# Patient Record
Sex: Male | Born: 1980 | Race: White | Hispanic: No | Marital: Married | Smoking: Never smoker
Health system: Southern US, Community
[De-identification: ages and names within clinical notes are randomized; demographics above are authoritative.]

## PROBLEM LIST (undated history)

## (undated) DIAGNOSIS — F410 Panic disorder [episodic paroxysmal anxiety] without agoraphobia: Secondary | ICD-10-CM

## (undated) DIAGNOSIS — I10 Essential (primary) hypertension: Secondary | ICD-10-CM

## (undated) DIAGNOSIS — S46219A Strain of muscle, fascia and tendon of other parts of biceps, unspecified arm, initial encounter: Secondary | ICD-10-CM

## (undated) DIAGNOSIS — R61 Generalized hyperhidrosis: Secondary | ICD-10-CM

## (undated) HISTORY — DX: Strain of muscle, fascia and tendon of other parts of biceps, unspecified arm, initial encounter: S46.219A

## (undated) HISTORY — DX: Panic disorder (episodic paroxysmal anxiety): F41.0

## (undated) HISTORY — DX: Generalized hyperhidrosis: R61

## (undated) HISTORY — DX: Essential (primary) hypertension: I10

---

## 2015-10-15 LAB — BASIC METABOLIC PANEL: GLUCOSE: 88 mg/dL

## 2015-10-15 LAB — LIPID PANEL
Cholesterol: 187 mg/dL (ref 0–200)
HDL: 45 mg/dL (ref 35–70)
LDL CALC: 125 mg/dL
LDL/HDL RATIO: 4.2
TRIGLYCERIDES: 85 mg/dL (ref 40–160)

## 2015-11-16 ENCOUNTER — Ambulatory Visit (INDEPENDENT_AMBULATORY_CARE_PROVIDER_SITE_OTHER): Payer: Commercial Managed Care - HMO | Admitting: Family Medicine

## 2015-11-16 ENCOUNTER — Encounter: Payer: Self-pay | Admitting: Family Medicine

## 2015-11-16 VITALS — BP 154/88 | HR 87 | Ht 67.0 in | Wt 192.0 lb

## 2015-11-16 DIAGNOSIS — I10 Essential (primary) hypertension: Secondary | ICD-10-CM

## 2015-11-16 DIAGNOSIS — F410 Panic disorder [episodic paroxysmal anxiety] without agoraphobia: Secondary | ICD-10-CM | POA: Diagnosis not present

## 2015-11-16 DIAGNOSIS — Z23 Encounter for immunization: Secondary | ICD-10-CM | POA: Diagnosis not present

## 2015-11-16 HISTORY — DX: Panic disorder (episodic paroxysmal anxiety): F41.0

## 2015-11-16 HISTORY — DX: Essential (primary) hypertension: I10

## 2015-11-16 MED ORDER — SERTRALINE HCL 25 MG PO TABS: 25.0000 mg | ORAL_TABLET | Freq: Every day | ORAL | Status: DC

## 2015-11-16 MED ORDER — LISINOPRIL 10 MG PO TABS: 10.0000 mg | ORAL_TABLET | Freq: Every day | ORAL | Status: DC

## 2015-11-16 NOTE — Progress Notes (Signed)
       Anthony Conway is a 35 y.o. male who presents to [facility] today for establish care and discuss possible panic attack.  Patient was in his normal state of health last week when he experienced palpitations and chest tightness while driving. This was felt with an overwhelming sense of anxiety. He pulled over and called EMS who presented to his car. He was diagnosed with a panic attack. Since then he's had felt worsening anxiety. He is fearful that he may have a repeat panic attack at any time. He especially is concerned about driving. He denies any current or new chest pains palpitations or shortness of breath. He denies any illicit drug use. He does not take any medications regularly. He is fit and active. He does note that he recently had biometric screening and occupational health clinic and was found to have elevated blood pressure.   History reviewed. No pertinent past medical history. History reviewed. No pertinent past surgical history. Social History  Substance Use Topics  . Smoking status: Current Every Day Smoker    Types: E-cigarettes  . Smokeless tobacco: Not on file  . Alcohol Use: 0.0 oz/week    0 Standard drinks or equivalent per week   family history includes Cancer in his maternal grandmother; Hypertension in his paternal grandfather; Stroke in his paternal grandfather.  ROS as above: No headache, visual changes, nausea, vomiting, diarrhea, constipation, dizziness, abdominal pain, skin rash, fevers, chills, night sweats, weight loss, swollen lymph nodes, body aches, joint swelling, muscle aches, chest pain, shortness of breath, mood changes, visual or auditory hallucinations.   Medications: Current Outpatient Prescriptions  Medication Sig Dispense Refill  . lisinopril (PRINIVIL,ZESTRIL) 10 MG tablet Take 1 tablet (10 mg total) by mouth daily. 30 tablet 0  . sertraline  (ZOLOFT) 25 MG tablet Take 1 tablet (25 mg total) by mouth daily. 30 tablet 0   No current facility-administered medications for this visit.   No Known Allergies   Exam:  BP 154/88 mmHg  Pulse 87  Ht 5\' 7"  (1.702 m)  Wt 192 lb (87.091 kg)  BMI 30.06 kg/m2 Gen: Well NAD HEENT: EOMI,  MMM Lungs: Normal work of breathing. CTABL Heart: RRR no MRG Abd: NABS, Soft. Nondistended, Nontender Exts: Brisk capillary refill, warm and well perfused.  Psych: Alert and oriented normal speech thought process and affect.  GAD 7 is 14 PHQ9 is 5 MDQ is 1  Twelve-lead EKG shows normal sinus rhythm at 72 bpm. No ST segment elevation or depression. Normal-appearing EKG.  No results found for this or any previous visit (from the past 24 hour(s)). No results found.   Please see individual assessment and plan sections.  Tdap given prior to discharge.

## 2015-11-16 NOTE — Patient Instructions (Signed)
Thank you for coming in today. Start both pills.  Attend counseling. Return in 2-4 weeks. Bring the lab records soon.  Panic Attacks Panic attacks are sudden, short-livedsurges of severe anxiety, fear, or discomfort. They may occur for no reason when you are relaxed, when you are anxious, or when you are sleeping. Panic attacks may occur for a number of reasons:   Healthy people occasionally have panic attacks in extreme, life-threatening situations, such as war or natural disasters. Normal anxiety is a protective mechanism of the body that helps [place] react to danger (fight or flight response).  Panic attacks are often seen with anxiety disorders, such as panic disorder, social anxiety disorder, generalized anxiety disorder, and phobias. Anxiety disorders cause excessive or uncontrollable anxiety. They may interfere with your relationships or other life activities.  Panic attacks are sometimes seen with other mental illnesses, such as depression and posttraumatic stress disorder.  Certain medical conditions, prescription medicines, and drugs of abuse can cause panic attacks. SYMPTOMS  Panic attacks start suddenly, peak within 20 minutes, and are accompanied by four or more of the following symptoms:  Pounding heart or fast heart rate (palpitations).  Sweating.  Trembling or shaking.  Shortness of breath or feeling smothered.  Feeling choked.  Chest pain or discomfort.  Nausea or strange feeling in your stomach.  Dizziness, light-headedness, or feeling like you will faint.  Chills or hot flushes.  Numbness or tingling in your lips or hands and feet.  Feeling that things are not real or feeling that you are not yourself.  Fear of losing control or going crazy.  Fear of dying. Some of these symptoms can mimic serious medical conditions. For example, you may think you are having a heart attack. Although panic attacks can be very scary, they are not life threatening. DIAGNOSIS    Panic attacks are diagnosed through an assessment by your health care provider. Your health care provider will ask questions about your symptoms, such as where and when they occurred. Your health care provider will also ask about your medical history and use of alcohol and drugs, including prescription medicines. Your health care provider may order blood tests or other studies to rule out a serious medical condition. Your health care provider may refer you to a mental health professional for further evaluation. TREATMENT   Most healthy people who have one or two panic attacks in an extreme, life-threatening situation will not require treatment.  The treatment for panic attacks associated with anxiety disorders or other mental illness typically involves counseling with a mental health professional, medicine, or a combination of both. Your health care provider will help determine what treatment is best for you.  Panic attacks due to physical illness usually go away with treatment of the illness. If prescription medicine is causing panic attacks, talk with your health care provider about stopping the medicine, decreasing the dose, or substituting another medicine.  Panic attacks due to alcohol or drug abuse go away with abstinence. Some adults need professional help in order to stop drinking or using drugs. HOME CARE INSTRUCTIONS   Take all medicines as directed by your health care provider.   Schedule and attend follow-up visits as directed by your health care provider. It is important to keep all your appointments. SEEK MEDICAL CARE IF:  You are not able to take your medicines as prescribed.  Your symptoms do not improve or get worse. SEEK IMMEDIATE MEDICAL CARE IF:   You experience panic attack symptoms that are different  than your usual symptoms.  You have serious thoughts about hurting yourself or others.  You are taking medicine for panic attacks and have a serious side effect. MAKE  SURE YOU:  Understand these instructions.  Will watch your condition.  Will get help right away if you are not doing well or get worse.   This information is not intended to replace advice given to you by your health care provider. Make sure you discuss any questions you have with your health care provider.   Document Released: 07/31/2005 Document Revised: 08/05/2013 Document Reviewed: 03/14/2013 Elsevier Interactive Patient Education 2016 [employer].  Hypertension Hypertension, commonly called high blood pressure, is when the force of blood pumping through your arteries is too strong. Your arteries are the blood vessels that carry blood from your heart throughout your body. A blood pressure reading consists of a higher number over a lower number, such as 110/72. The higher number (systolic) is the pressure inside your arteries when your heart pumps. The lower number (diastolic) is the pressure inside your arteries when your heart relaxes. Ideally you want your blood pressure below 120/80. Hypertension forces your heart to work harder to pump blood. Your arteries may become narrow or stiff. Having untreated or uncontrolled hypertension can cause heart attack, stroke, kidney disease, and other problems. RISK FACTORS Some risk factors for high blood pressure are controllable. Others are not.  Risk factors you cannot control include:   Race. You may be at higher risk if you are African American.  Age. Risk increases with age.  Gender. Men are at higher risk than women before age 56 years. After age 76, women are at higher risk than men. Risk factors you can control include:  Not getting enough exercise or physical activity.  Being overweight.  Getting too much fat, sugar, calories, or salt in your diet.  Drinking too much alcohol. SIGNS AND SYMPTOMS Hypertension does not usually cause signs or symptoms. Extremely high blood pressure (hypertensive crisis) may cause headache,  anxiety, shortness of breath, and nosebleed. DIAGNOSIS To check if you have hypertension, your health care provider will measure your blood pressure while you are seated, with your arm held at the level of your heart. It should be measured at least twice using the same arm. Certain conditions can cause a difference in blood pressure between your right and left arms. A blood pressure reading that is higher than normal on one occasion does not mean that you need treatment. If it is not clear whether you have high blood pressure, you may be asked to return on a different day to have your blood pressure checked again. Or, you may be asked to monitor your blood pressure at home for 1 or more weeks. TREATMENT Treating high blood pressure includes making lifestyle changes and possibly taking medicine. Living a healthy lifestyle can help lower high blood pressure. You may need to change some of your habits. Lifestyle changes may include:  Following the DASH diet. This diet is high in fruits, vegetables, and whole grains. It is low in salt, red meat, and added sugars.  Keep your sodium intake below 2,300 mg per day.  Getting at least 30-45 minutes of aerobic exercise at least 4 times per week.  Losing weight if necessary.  Not smoking.  Limiting alcoholic beverages.  Learning ways to reduce stress. Your health care provider may prescribe medicine if lifestyle changes are not enough to get your blood pressure under control, and if one of the following  is true:  You are 2818-37 years of age and your systolic blood pressure is above 140.  You are 37 years of age or older, and your systolic blood pressure is above 150.  Your diastolic blood pressure is above 90.  You have diabetes, and your systolic blood pressure is over 140 or your diastolic blood pressure is over 90.  You have kidney disease and your blood pressure is above 140/90.  You have heart disease and your blood pressure is above  140/90. Your personal target blood pressure may vary depending on your medical conditions, your age, and other factors. HOME CARE INSTRUCTIONS  Have your blood pressure rechecked as directed by your health care provider.   Take medicines only as directed by your health care provider. Follow the directions carefully. Blood pressure medicines must be taken as prescribed. The medicine does not work as well when you skip doses. Skipping doses also puts you at risk for problems.  Do not smoke.   Monitor your blood pressure at home as directed by your health care provider. SEEK MEDICAL CARE IF:   You think you are having a reaction to medicines taken.  You have recurrent headaches or feel dizzy.  You have swelling in your ankles.  You have trouble with your vision. SEEK IMMEDIATE MEDICAL CARE IF:  You develop a severe headache or confusion.  You have unusual weakness, numbness, or feel faint.  You have severe chest or abdominal pain.  You vomit repeatedly.  You have trouble breathing. MAKE SURE YOU:   Understand these instructions.  Will watch your condition.  Will get help right away if you are not doing well or get worse.   This information is not intended to replace advice given to you by your health care provider. Make sure you discuss any questions you have with your health care provider.   Document Released: 07/31/2005 Document Revised: 12/15/2014 Document Reviewed: 05/23/2013 Elsevier Interactive Patient Education [employer]2016 Elsevier Inc.

## 2015-11-16 NOTE — Assessment & Plan Note (Signed)
Symptoms are consistent with panic disorder with some component of generalized anxiety disorder. Plan to start low-dose sertraline and refer to counseling. Recheck in 2-4 weeks.

## 2015-11-16 NOTE — Assessment & Plan Note (Signed)
Truly elevated multiple visits. Patient has had recent labs. He'll bring labs records. Start low-dose lisinopril. Recheck in one month.

## 2015-12-03 ENCOUNTER — Encounter: Payer: Self-pay | Admitting: Family Medicine

## 2015-12-03 ENCOUNTER — Ambulatory Visit (INDEPENDENT_AMBULATORY_CARE_PROVIDER_SITE_OTHER): Payer: Commercial Managed Care - HMO | Admitting: Family Medicine

## 2015-12-03 VITALS — BP 139/83 | HR 74 | Wt 189.0 lb

## 2015-12-03 DIAGNOSIS — I10 Essential (primary) hypertension: Secondary | ICD-10-CM

## 2015-12-03 DIAGNOSIS — R61 Generalized hyperhidrosis: Secondary | ICD-10-CM | POA: Diagnosis not present

## 2015-12-03 DIAGNOSIS — F410 Panic disorder [episodic paroxysmal anxiety] without agoraphobia: Secondary | ICD-10-CM | POA: Diagnosis not present

## 2015-12-03 HISTORY — DX: Generalized hyperhidrosis: R61

## 2015-12-03 LAB — BASIC METABOLIC PANEL
BUN: 22 mg/dL (ref 7–25)
CHLORIDE: 103 mmol/L (ref 98–110)
CO2: 24 mmol/L (ref 20–31)
CREATININE: 1.38 mg/dL — AB (ref 0.60–1.35)
Calcium: 9.3 mg/dL (ref 8.6–10.3)
Glucose, Bld: 88 mg/dL (ref 65–99)
Potassium: 4.4 mmol/L (ref 3.5–5.3)
Sodium: 137 mmol/L (ref 135–146)

## 2015-12-03 MED ORDER — LISINOPRIL 10 MG PO TABS: 10.0000 mg | ORAL_TABLET | Freq: Every day | ORAL | Status: DC

## 2015-12-03 MED ORDER — ALUMINUM CHLORIDE 20 % EX SOLN: CUTANEOUS | Status: DC

## 2015-12-03 MED ORDER — SERTRALINE HCL 25 MG PO TABS: 25.0000 mg | ORAL_TABLET | Freq: Every day | ORAL | Status: DC

## 2015-12-03 NOTE — Assessment & Plan Note (Signed)
Doing well on lisinopril. Check basic metabolic panel. Return in one year or sooner if needed.

## 2015-12-03 NOTE — Assessment & Plan Note (Signed)
Original goal was to increase to 100 mg daily. However the patient has complete resolution of symptoms. Continue 25 mg and recheck in 1 year or sooner if needed

## 2015-12-03 NOTE — Progress Notes (Signed)
       Anthony Conway is a 35 y.o. male who [facility] today for follow-up hypertension and anxiety. Patient is doing very well. His anxiety has essentially completely resolved. He denies any chest pains palpitations or shortness of breath. He notes a history of excessive sweating and was previously prescribed aluminum chloride antiperspirant. He would like a refill if possible.   No past medical history on file. No past surgical history on file. Social History  Substance Use Topics  . Smoking status: Current Every Day Smoker    Types: E-cigarettes  . Smokeless tobacco: Not on file  . Alcohol Use: 0.0 oz/week    0 Standard drinks or equivalent per week   family history includes Cancer in his maternal grandmother; Hypertension in his paternal grandfather; Stroke in his paternal grandfather.  ROS as above Medications: Current Outpatient Prescriptions  Medication Sig Dispense Refill  . lisinopril (PRINIVIL,ZESTRIL) 10 MG tablet Take 1 tablet (10 mg total) by mouth daily. 90 tablet 3  . sertraline (ZOLOFT) 25 MG tablet Take 1 tablet (25 mg total) by mouth daily. 90 tablet 3  . aluminum chloride (DRYSOL) 20 % external solution Apply daily at bedtime for 3 days then weekly to areas of excessive sweat. 225 mL 12   No current facility-administered medications for this visit.   No Known Allergies   Exam:  BP 139/83 mmHg  Pulse 74  Wt 189 lb (85.73 kg) Gen: Well NAD HEENT: EOMI,  MMM Lungs: Normal work of breathing. CTABL Heart: RRR no MRG Abd: NABS, Soft. Nondistended, Nontender Exts: Brisk capillary refill, warm and well perfused.  Psych: Alert and oriented normal speech thought process and affect  GAD 7 is 0  No results found for this or any previous visit (from the past 24 hour(s)). No results found.   Please see individual assessment and plan sections.

## 2015-12-03 NOTE — Patient Instructions (Signed)
Thank you for coming in today. Continue the medicines.  Start Drysol.  Get labs today.  Return in 1 year or sooner if needed.

## 2015-12-03 NOTE — Assessment & Plan Note (Signed)
Prescribe Drysol

## 2015-12-05 NOTE — Progress Notes (Signed)
Quick Note:  The kidney function is a bit abnormal. This is probably a false result due to the labs. We will repeat the labs in 1 month. Return for a lab visit (no need to see me) in 1 month. ______

## 2016-01-20 ENCOUNTER — Other Ambulatory Visit: Payer: Self-pay | Admitting: Family Medicine

## 2016-01-20 DIAGNOSIS — R7989 Other specified abnormal findings of blood chemistry: Secondary | ICD-10-CM

## 2016-01-21 LAB — BASIC METABOLIC PANEL WITH GFR
BUN: 19 mg/dL (ref 7–25)
CHLORIDE: 99 mmol/L (ref 98–110)
CO2: 26 mmol/L (ref 20–31)
Calcium: 9.6 mg/dL (ref 8.6–10.3)
Creat: 1.4 mg/dL — ABNORMAL HIGH (ref 0.60–1.35)
GFR, EST AFRICAN AMERICAN: 74 mL/min (ref >=60)
GFR, Est Non African American: 64 mL/min (ref >=60)
GLUCOSE: 81 mg/dL (ref 65–99)
POTASSIUM: 4.3 mmol/L (ref 3.5–5.3)
Sodium: 137 mmol/L (ref 135–146)

## 2016-01-21 NOTE — Progress Notes (Signed)
Quick Note:  Labs are stable. Creatine is still elevated. We will watch this every year. ______

## 2016-08-14 HISTORY — PX: DISTAL BICEPS TENDON REPAIR: SHX1461

## 2016-12-12 ENCOUNTER — Other Ambulatory Visit: Payer: Self-pay | Admitting: Family Medicine

## 2016-12-12 DIAGNOSIS — F410 Panic disorder [episodic paroxysmal anxiety] without agoraphobia: Secondary | ICD-10-CM

## 2016-12-12 DIAGNOSIS — I10 Essential (primary) hypertension: Secondary | ICD-10-CM

## 2017-01-14 ENCOUNTER — Other Ambulatory Visit: Payer: Self-pay | Admitting: Family Medicine

## 2017-01-14 DIAGNOSIS — I10 Essential (primary) hypertension: Secondary | ICD-10-CM

## 2017-01-14 DIAGNOSIS — F410 Panic disorder [episodic paroxysmal anxiety] without agoraphobia: Secondary | ICD-10-CM

## 2017-01-22 ENCOUNTER — Other Ambulatory Visit: Payer: Self-pay | Admitting: Family Medicine

## 2017-01-22 DIAGNOSIS — I10 Essential (primary) hypertension: Secondary | ICD-10-CM

## 2017-03-22 ENCOUNTER — Ambulatory Visit (INDEPENDENT_AMBULATORY_CARE_PROVIDER_SITE_OTHER): Payer: 59 | Admitting: Family Medicine

## 2017-03-22 ENCOUNTER — Ambulatory Visit (INDEPENDENT_AMBULATORY_CARE_PROVIDER_SITE_OTHER): Payer: 59

## 2017-03-22 DIAGNOSIS — M25522 Pain in left elbow: Secondary | ICD-10-CM

## 2017-03-22 DIAGNOSIS — X58XXXA Exposure to other specified factors, initial encounter: Secondary | ICD-10-CM

## 2017-03-22 DIAGNOSIS — S59802A Other specified injuries of left elbow, initial encounter: Secondary | ICD-10-CM

## 2017-03-22 NOTE — Progress Notes (Signed)
Anthony Conway is a 36 y.o. male who presents to [facility] today for left arm pain.  Patient reports left arm pain for 3 days. Patient reached out to catch himself with his left arm and hyperextended it. When the injury occurred he noticed a crunch and a pop. The arm was immediately painful and 8/10 in severity. He noticed significant bruising and swelling over the next day in his lower arm and upper forearm. Patient reports icing these areas with some improvement in the swelling. Patient has noted that he cannot fully flex his left bicep and hyperextending his arm produces pain. The pain is now about a 1/10 in severity while at rest.    No past medical history on file. No past surgical history on file. Social History  Substance Use Topics  . Smoking status: Current Every Day Smoker    Types: E-cigarettes  . Smokeless tobacco: Not on file  . Alcohol use 0.0 oz/week     ROS:  As above   Medications: Current Outpatient Prescriptions  Medication Sig Dispense Refill  . lisinopril (PRINIVIL,ZESTRIL) 10 MG tablet Take 1 tablet (10 mg total) by mouth daily. Due for follow up visit (Patient not taking: Reported on 03/22/2017) 30 tablet 0  . sertraline (ZOLOFT) 25 MG tablet Take 1 tablet (25 mg total) by mouth daily. Due for follow up visit (Patient not taking: Reported on 03/22/2017) 30 tablet 0   No current facility-administered medications for this visit.    No Known Allergies   Exam:  BP 134/88   Pulse 80   Wt 203 lb (92.1 kg)   BMI 31.79 kg/m  General: Well Developed, well nourished, and in no acute distress.  Neuro/Psych: Alert and oriented x3, extra-ocular muscles intact, able to move all 4 extremities, sensation grossly intact. Skin: Warm and dry, no rashes noted.  Respiratory: Not using accessory muscles, speaking in full sentences, trachea midline.  Cardiovascular: Pulses palpable, no extremity edema. Abdomen: Does not appear  distended. MSK: Left arm: On inspection biceps is retracted superiorly when compared to right, significant ecchymosis present in lower 1/3 of arm and upper 2/3 of forearm, swelling present Tender to palpation over area of lower biceps tendon, unable to isolate biceps tendon with Hook Test Range of motion is full Decreased strength with supination of forearm, decreased strength with flexion of forearm     No results found for this or any previous visit (from the past 48 hour(s)). Dg Elbow Complete Left  Result Date: 03/22/2017 CLINICAL DATA:  Hyperextension injury to the left elbow 4 days ago. EXAM: LEFT ELBOW - COMPLETE 3+ VIEW COMPARISON:  None. FINDINGS: The joint spaces are maintained. No acute fractures identified. No joint effusion. IMPRESSION: No acute bony findings. Electronically Signed   By: Rudie Meyer M.D.   On: 03/22/2017 15:08     Patient was placed into a well-formed posterior arm splint with a 90 elbow flexion  Assessment and Plan: 36 y.o. male with left arm pain. Given patient's characterization of injury and physical exam findings, I am concerned that he has a distal biceps tendon tear. Patient was placed in a posterior arm splint to keep his elbow at 90 degrees of flexion. Plan for MRI of the elbow to evaluate for tendon rupture. Patient will most likely require a referral to orthopedic surgery for surgical repair of the tendon. Patient lives and works in [place] and would like to go to see an Investment banker, operational in [place].  Orders Placed This Encounter  Procedures  . MR ELBOW LEFT WO CONTRAST    Standing Status:   Future    Standing Expiration Date:   05/22/2018    Order Specific Question:   What is the patient's sedation requirement?    Answer:   No Sedation    Order Specific Question:   Does the patient have a pacemaker or implanted devices?    Answer:   No    Order Specific Question:   Preferred imaging location?    Answer[facility]   [facility] (table  limit-350lbs)    Order Specific Question:   Radiology Contrast Protocol - do NOT remove file path    Answer:   \\[email or web address]  . DG Elbow Complete Left    Standing Status:   Future    Number of Occurrences:   1    Standing Expiration Date:   05/22/2018    Order Specific Question:   Reason for Exam (SYMPTOM  OR DIAGNOSIS REQUIRED)    Answer:   eval poss biceps tendon tear. OK to remove splint    Order Specific Question:   Preferred imaging location?    [facility]    Order Specific Question:   Radiology Contrast Protocol - do NOT remove file path    Answer:   \\[email or web address]   No orders of the defined types were placed in this encounter.   Discussed warning signs or symptoms. Please see discharge instructions. Patient expresses understanding.

## 2017-03-22 NOTE — Patient Instructions (Signed)
Thank you for coming in today.  You should hear about MRI soon.  Get xray now.   Recheck with me a few days after MRI.

## 2017-03-22 NOTE — Progress Notes (Signed)
1 

## 2017-03-23 ENCOUNTER — Other Ambulatory Visit: Payer: Self-pay | Admitting: Family Medicine

## 2017-03-23 DIAGNOSIS — Z1389 Encounter for screening for other disorder: Secondary | ICD-10-CM

## 2017-03-23 DIAGNOSIS — Z0189 Encounter for other specified special examinations: Secondary | ICD-10-CM

## 2017-03-26 ENCOUNTER — Ambulatory Visit (INDEPENDENT_AMBULATORY_CARE_PROVIDER_SITE_OTHER): Payer: 59

## 2017-03-26 DIAGNOSIS — Z575 Occupational exposure to toxic agents in other industries: Secondary | ICD-10-CM

## 2017-03-26 DIAGNOSIS — S46212D Strain of muscle, fascia and tendon of other parts of biceps, left arm, subsequent encounter: Secondary | ICD-10-CM

## 2017-03-26 DIAGNOSIS — X58XXXD Exposure to other specified factors, subsequent encounter: Secondary | ICD-10-CM

## 2017-03-26 DIAGNOSIS — Z1389 Encounter for screening for other disorder: Secondary | ICD-10-CM | POA: Diagnosis not present

## 2017-03-26 DIAGNOSIS — M25522 Pain in left elbow: Secondary | ICD-10-CM

## 2017-03-27 ENCOUNTER — Telehealth: Payer: Self-pay | Admitting: Family Medicine

## 2017-03-27 DIAGNOSIS — S46212A Strain of muscle, fascia and tendon of other parts of biceps, left arm, initial encounter: Secondary | ICD-10-CM

## 2017-03-27 DIAGNOSIS — S46219A Strain of muscle, fascia and tendon of other parts of biceps, unspecified arm, initial encounter: Secondary | ICD-10-CM | POA: Insufficient documentation

## 2017-03-27 HISTORY — DX: Strain of muscle, fascia and tendon of other parts of biceps, unspecified arm, initial encounter: S46.219A

## 2017-03-27 NOTE — Telephone Encounter (Signed)
Pt advised of results and has been scheduled for an appointment. Referral coordinator went over appointment detail. No further questions.

## 2017-03-27 NOTE — Telephone Encounter (Signed)
MRI confirms complete tear of the biceps tendon.  This will require surgery.  I have ordered a referral today.  You should hear soon from an orthopedic surgery office soon about the details.  Please swing by the radiology department and get a CD of the MRI made to bring with you to the orthopedic surgery appointment.  Return to clinic if needed for further evaluation or help.

## 2017-10-18 LAB — LIPID PANEL
Cholesterol: 184 (ref 0–200)
HDL: 45 (ref 35–70)
LDL CALC: 122
LDl/HDL Ratio: 4.1
Triglycerides: 78 (ref 40–160)

## 2017-10-18 LAB — BASIC METABOLIC PANEL: Glucose: 96

## 2017-12-12 ENCOUNTER — Ambulatory Visit (INDEPENDENT_AMBULATORY_CARE_PROVIDER_SITE_OTHER): Payer: 59 | Admitting: Family Medicine

## 2017-12-12 ENCOUNTER — Encounter: Payer: Self-pay | Admitting: Family Medicine

## 2017-12-12 VITALS — BP 143/87 | HR 80 | Ht 67.0 in | Wt 197.0 lb

## 2017-12-12 DIAGNOSIS — I10 Essential (primary) hypertension: Secondary | ICD-10-CM | POA: Diagnosis not present

## 2017-12-12 DIAGNOSIS — Z114 Encounter for screening for human immunodeficiency virus [HIV]: Secondary | ICD-10-CM | POA: Diagnosis not present

## 2017-12-12 DIAGNOSIS — F410 Panic disorder [episodic paroxysmal anxiety] without agoraphobia: Secondary | ICD-10-CM | POA: Diagnosis not present

## 2017-12-12 DIAGNOSIS — Z0000 Encounter for general adult medical examination without abnormal findings: Secondary | ICD-10-CM

## 2017-12-12 MED ORDER — LISINOPRIL 10 MG PO TABS: 10.0000 mg | ORAL_TABLET | Freq: Every day | ORAL | 1 refills | Status: DC

## 2017-12-12 MED ORDER — SERTRALINE HCL 50 MG PO TABS: 50.0000 mg | ORAL_TABLET | Freq: Every day | ORAL | 1 refills | Status: DC

## 2017-12-12 NOTE — Progress Notes (Signed)
Anthony Conway is a 37 y.o. male who presents to [facility]: Primary Care Sports Medicine today for well adult visit.  Anthony Conway presents to clinic today feeling pretty well.  He has a history of hypertension and anxiety.  He has discontinued both the lisinopril and the Zoloft.  He was previously taking 10 mg of lisinopril and 25 mg of sertraline.  He ran out of the medications.  He notes that he really could not tell that he was taking the lisinopril at all and was not measuring blood pressure at home.  As for the sertraline he notes that his anxiety was he thinks better controlled on it.  He notes his anxiety is worsening off of the medication now for some time.  He no longer is having current panic attacks.  Patient notes that he had labs performed at work in March.  He had a fasting cholesterol and glucose that were in the normal range and abstracted below.   Past Medical History:  Diagnosis Date  . Excessive sweating 12/03/2015  . HTN (hypertension) 11/16/2015  . Panic attack 11/16/2015  . Tear of distal tendon of biceps 03/27/2017   Past Surgical History:  Procedure Laterality Date  . DISTAL BICEPS TENDON REPAIR Left 2018   Social History   Tobacco Use  . Smoking status: Never Smoker  . Smokeless tobacco: Never Used  Substance Use Topics  . Alcohol use: Yes    Alcohol/week: 0.0 oz   family history includes Cancer in his maternal grandmother; Hypertension in his paternal grandfather; Stroke in his paternal grandfather.  ROS as above:  Medications: Current Outpatient Medications  Medication Sig Dispense Refill  . lisinopril (PRINIVIL,ZESTRIL) 10 MG tablet Take 1 tablet (10 mg total) by mouth daily. 90 tablet 1  . sertraline (ZOLOFT) 50 MG tablet Take 1 tablet (50 mg total) by mouth daily. 90 tablet 1   No current facility-administered medications for this visit.    No Known Allergies  Health  Maintenance Health Maintenance  Topic Date Due  . HIV Screening  03/18/1994  . INFLUENZA VACCINE  03/14/2018  . TETANUS/TDAP  11/15/2025     Exam:  BP (!) 143/87   Pulse 80   Ht  (1.702 m)   Wt 197 lb (89.4 kg)   BMI 30.85 kg/m  Gen: Well NAD HEENT: EOMI,  MMM Lungs: Normal work of breathing. CTABL Heart: RRR no MRG Abd: NABS, Soft. Nondistended, Nontender Exts: Brisk capillary refill, warm and well perfused.  Skin: No concerning lesions. Psych alert and oriented normal speech thought process and affect.  Depression screen [facility] 2/9 12/12/2017 03/22/2017  Decreased Interest 1 0  Down, Depressed, Hopeless 0 0  PHQ - 2 Score 1 0  Altered sleeping 0 -  Tired, decreased energy 1 -  Change in appetite 0 -  Feeling bad or failure about yourself  0 -  Trouble concentrating 0 -  Moving slowly or fidgety/restless 0 -  Suicidal thoughts 0 -  PHQ-9 Score 2 -  Difficult doing work/chores Not difficult at all -   GAD 7 : Generalized Anxiety Score 12/12/2017  Nervous, Anxious, on Edge 3  Control/stop worrying 0  Worry too much - different things 1  Trouble relaxing 1  Restless 0  Easily annoyed or irritable 1  Afraid - awful might happen 1  Total GAD 7 Score 7      Lab Results  Component Value Date   CHOL 184 10/18/2017  HDL 45 10/18/2017   LDLCALC 122 10/18/2017   TRIG 78 10/18/2017     Assessment and Plan: 37 y.o. male with  Well adult.  Doing reasonably well.  Anxiety is worsening.  Individual medical problems discussed below.  Hypertension: Not at goal.  Restart lisinopril.  Send blood pressure log in 1 month.  Recheck metabolic panel in 1 month.  If all is well recheck yearly.  Anxiety: Worsening slightly.  Restart Zoloft.  Titrate from 25 to 50 mg and send report in 1 month.  If all is well recheck yearly  Additional check HIV for screening.  Orders Placed This Encounter  Procedures  . COMPLETE METABOLIC PANEL WITH GFR  . HIV antibody  . Basic  metabolic panel    This external order was created through the Results Console.  . Lipid panel    This external order was created through the Results Console.   Meds ordered this encounter  Medications  . lisinopril (PRINIVIL,ZESTRIL) 10 MG tablet    Sig: Take 1 tablet (10 mg total) by mouth daily.    Dispense:  90 tablet    Refill:  1  . sertraline (ZOLOFT) 50 MG tablet    Sig: Take 1 tablet (50 mg total) by mouth daily.    Dispense:  90 tablet    Refill:  1     Discussed warning signs or symptoms. Please see discharge instructions. Patient expresses understanding.

## 2017-12-12 NOTE — Patient Instructions (Signed)
Thank you for coming in today. Get labs in about 1 month.  Restart lisinopril at  daily.  Restart zoloft (sertaline) for anxiety.  Take 1/2 pill daily for 1 week then increase to 1 pill.  Send me an update in 1 month.  If all is well recheck yearly for physical.  I am here for issues if you have any.

## 2018-06-17 ENCOUNTER — Other Ambulatory Visit: Payer: Self-pay | Admitting: Family Medicine

## 2018-06-17 DIAGNOSIS — F410 Panic disorder [episodic paroxysmal anxiety] without agoraphobia: Secondary | ICD-10-CM

## 2018-06-17 DIAGNOSIS — I10 Essential (primary) hypertension: Secondary | ICD-10-CM

## 2018-07-19 ENCOUNTER — Other Ambulatory Visit: Payer: Self-pay | Admitting: Family Medicine

## 2018-07-19 DIAGNOSIS — F410 Panic disorder [episodic paroxysmal anxiety] without agoraphobia: Secondary | ICD-10-CM

## 2018-07-20 ENCOUNTER — Other Ambulatory Visit: Payer: Self-pay | Admitting: Family Medicine

## 2018-07-20 DIAGNOSIS — I10 Essential (primary) hypertension: Secondary | ICD-10-CM

## 2018-08-23 LAB — COMPLETE METABOLIC PANEL WITH GFR
AG Ratio: 1.8 (calc) (ref 1.0–2.5)
ALT: 16 U/L (ref 9–46)
AST: 14 U/L (ref 10–40)
Albumin: 4.7 g/dL (ref 3.6–5.1)
Alkaline phosphatase (APISO): 47 U/L (ref 40–115)
BUN: 15 mg/dL (ref 7–25)
CALCIUM: 9.9 mg/dL (ref 8.6–10.3)
CO2: 27 mmol/L (ref 20–32)
Chloride: 103 mmol/L (ref 98–110)
Creat: 1.23 mg/dL (ref 0.60–1.35)
GFR, EST AFRICAN AMERICAN: 85 mL/min/{1.73_m2} (ref >=60)
GFR, EST NON AFRICAN AMERICAN: 73 mL/min/{1.73_m2} (ref >=60)
GLUCOSE: 99 mg/dL (ref 65–99)
Globulin: 2.6 g/dL (calc) (ref 1.9–3.7)
Potassium: 4.4 mmol/L (ref 3.5–5.3)
Sodium: 139 mmol/L (ref 135–146)
TOTAL PROTEIN: 7.3 g/dL (ref 6.1–8.1)
Total Bilirubin: 0.7 mg/dL (ref 0.2–1.2)

## 2018-08-23 LAB — HIV ANTIBODY (ROUTINE TESTING W REFLEX): HIV 1&2 Ab, 4th Generation: NONREACTIVE

## 2018-08-28 ENCOUNTER — Other Ambulatory Visit: Payer: Self-pay | Admitting: Family Medicine

## 2018-08-28 DIAGNOSIS — I10 Essential (primary) hypertension: Secondary | ICD-10-CM

## 2018-08-29 MED ORDER — LISINOPRIL 10 MG PO TABS: 10.0000 mg | ORAL_TABLET | Freq: Every day | ORAL | 2 refills | Status: DC

## 2018-10-13 LAB — LIPID PANEL
Cholesterol: 189 (ref 0–200)
HDL: 47 (ref 35–70)
LDL Cholesterol: 124
Triglycerides: 85 (ref 40–160)

## 2018-10-13 LAB — BASIC METABOLIC PANEL: Glucose: 97

## 2018-10-23 ENCOUNTER — Other Ambulatory Visit: Payer: Self-pay | Admitting: Family Medicine

## 2018-10-23 DIAGNOSIS — I10 Essential (primary) hypertension: Secondary | ICD-10-CM

## 2018-12-19 ENCOUNTER — Other Ambulatory Visit: Payer: Self-pay | Admitting: Family Medicine

## 2018-12-19 DIAGNOSIS — I10 Essential (primary) hypertension: Secondary | ICD-10-CM

## 2019-01-08 ENCOUNTER — Encounter: Payer: Self-pay | Admitting: Family Medicine

## 2019-01-08 ENCOUNTER — Ambulatory Visit (INDEPENDENT_AMBULATORY_CARE_PROVIDER_SITE_OTHER): Payer: 59 | Admitting: Family Medicine

## 2019-01-08 VITALS — BP 146/84 | HR 76 | Temp 98.1°F | Wt 191.0 lb

## 2019-01-08 DIAGNOSIS — N489 Disorder of penis, unspecified: Secondary | ICD-10-CM | POA: Diagnosis not present

## 2019-01-08 DIAGNOSIS — I10 Essential (primary) hypertension: Secondary | ICD-10-CM

## 2019-01-08 DIAGNOSIS — Z0000 Encounter for general adult medical examination without abnormal findings: Secondary | ICD-10-CM

## 2019-01-08 DIAGNOSIS — F410 Panic disorder [episodic paroxysmal anxiety] without agoraphobia: Secondary | ICD-10-CM | POA: Diagnosis not present

## 2019-01-08 MED ORDER — LISINOPRIL 10 MG PO TABS: 10.0000 mg | ORAL_TABLET | Freq: Every day | ORAL | 3 refills | Status: AC

## 2019-01-08 NOTE — Progress Notes (Signed)
Anthony Conway is a 38 y.o. male who presents to [facility]: Primary Care Sports Medicine today for well adult visit.   Anthony Conway is doing well.  He notes that he has a history of anxiety and panic attacks that previously was controlled with Zoloft.  He has been out of this medicine for quite a while but feels quite well.  He is happy that things are going and would like to remain off of medication if possible.  He also has a history of hypertension.  This typically has been well controlled with lisinopril but he ran out of his medicine 3 days ago.  He does not check his blood pressure at home.  No chest pain palpitation lightheadedness shortness of breath fevers or chills.  He exercises regularly and tries eat a healthy careful diet.  He does note however he has a small rash on his penis.  This is been present for a few weeks and occurs off and on.  His girlfriend does not have any other symptoms.  He denies any pain or discharge.  He feels well otherwise.  He notes that he had labs done for work for cholesterol a few months ago.   ROS as above:  Past Medical History:  Diagnosis Date  . Excessive sweating 12/03/2015  . HTN (hypertension) 11/16/2015  . Panic attack 11/16/2015  . Tear of distal tendon of biceps 03/27/2017   Past Surgical History:  Procedure Laterality Date  . DISTAL BICEPS TENDON REPAIR Left 2018   Social History   Tobacco Use  . Smoking status: Never Smoker  . Smokeless tobacco: Never Used  Substance Use Topics  . Alcohol use: Yes    Alcohol/week: 0.0 standard drinks   family history includes Cancer in his maternal grandmother; Hypertension in his paternal grandfather; Stroke in his paternal grandfather.  Medications: Current Outpatient Medications  Medication Sig Dispense Refill  . lisinopril (ZESTRIL) 10 MG tablet Take 1 tablet (10 mg total) by mouth daily. 90 tablet 3    No current facility-administered medications for this visit.    No Known Allergies  Health Maintenance Health Maintenance  Topic Date Due  . INFLUENZA VACCINE  03/15/2019  . TETANUS/TDAP  11/15/2025  . HIV Screening  Completed     Exam:  BP (!) 146/84   Pulse 76   Temp 98.1 F (36.7 C) (Oral)   Wt 191 lb (86.6 kg)   BMI 29.91 kg/m  Wt Readings from Last 5 Encounters:  01/08/19 191 lb (86.6 kg)  12/12/17 197 lb (89.4 kg)  03/22/17 203 lb (92.1 kg)  12/03/15 189 lb (85.7 kg)  11/16/15 192 lb (87.1 kg)      Gen: Well NAD HEENT: EOMI,  MMM Lungs: Normal work of breathing. CTABL Heart: RRR no MRG Abd: NABS, Soft. Nondistended, Nontender Exts: Brisk capillary refill, warm and well perfused.  Psych: Alert and oriented normal speech thought process and affect.  No SI HI expressed. Genitals: Normal circumcised penis with small scaly patch on the undersurface of the glans.  No penile discharge nontender.  No other lesions present.  Depression screen [facility] 2/9 01/08/2019 12/12/2017 03/22/2017  Decreased Interest 0 1 0  Down, Depressed, Hopeless 0 0 0  PHQ - 2 Score 0 1 0  Altered sleeping 1 0 -  Tired, decreased energy 0 1 -  Change in appetite 0 0 -  Feeling bad or failure about yourself  0 0 -  Trouble concentrating 0  0 -  Moving slowly or fidgety/restless 0 0 -  Suicidal thoughts 0 0 -  PHQ-9 Score 1 2 -  Difficult doing work/chores Not difficult at all Not difficult at all -       Assessment and Plan: 38 y.o. male with  Well adult.  Doing well.  Blood pressure bit elevated today patient out of medication.  Plan to restart lisinopril and keep home blood pressure log and report back.  Anxiety well controlled.  Hold on Zoloft for now recheck as needed.  Penile lesion looks like dry skin to me.  Plan get further work-up including gonorrhea chlamydia HIV and RPR.   PDMP not reviewed this encounter. Orders Placed This Encounter  Procedures  . C. trachomatis/N.  gonorrhoeae RNA  . HIV Antibody (routine testing w rflx)  . RPR  . Basic metabolic panel    This external order was created through the Results Console.  . Lipid panel    This external order was created through the Results Console.   Meds ordered this encounter  Medications  . lisinopril (ZESTRIL) 10 MG tablet    Sig: Take 1 tablet (10 mg total) by mouth daily.    Dispense:  90 tablet    Refill:  3     Discussed warning signs or symptoms. Please see discharge instructions. Patient expresses understanding.

## 2019-01-08 NOTE — Patient Instructions (Addendum)
Thank you for coming in today. Restart lisinopril.  Let me know what your blood pressures at home are.  Let me know if your anxiety worsens.  We can restart zoloft.    Plan on recheck in 1 year.  Return sooner if needed.  Will get labs next year.   I do recommend that you take your vit D    Send me your labs from [facility] recently via mychart.    Return sooner if needed.

## 2019-01-09 LAB — C. TRACHOMATIS/N. GONORRHOEAE RNA
C. trachomatis RNA, TMA: NOT DETECTED
N. gonorrhoeae RNA, TMA: NOT DETECTED

## 2019-01-09 LAB — HIV ANTIBODY (ROUTINE TESTING W REFLEX): HIV 1&2 Ab, 4th Generation: NONREACTIVE

## 2019-01-09 LAB — RPR: RPR Ser Ql: NONREACTIVE

## 2019-02-15 IMAGING — DX DG ORBITS FOR FOREIGN BODY
2 series · 2 of 2 positions shown · non-contrast
Comparison: None.

CLINICAL DATA: Metal working/exposure; clearance prior to MRI

EXAM:
ORBITS FOR FOREIGN BODY - 2 VIEW

[orbits waters (1 of 2)]
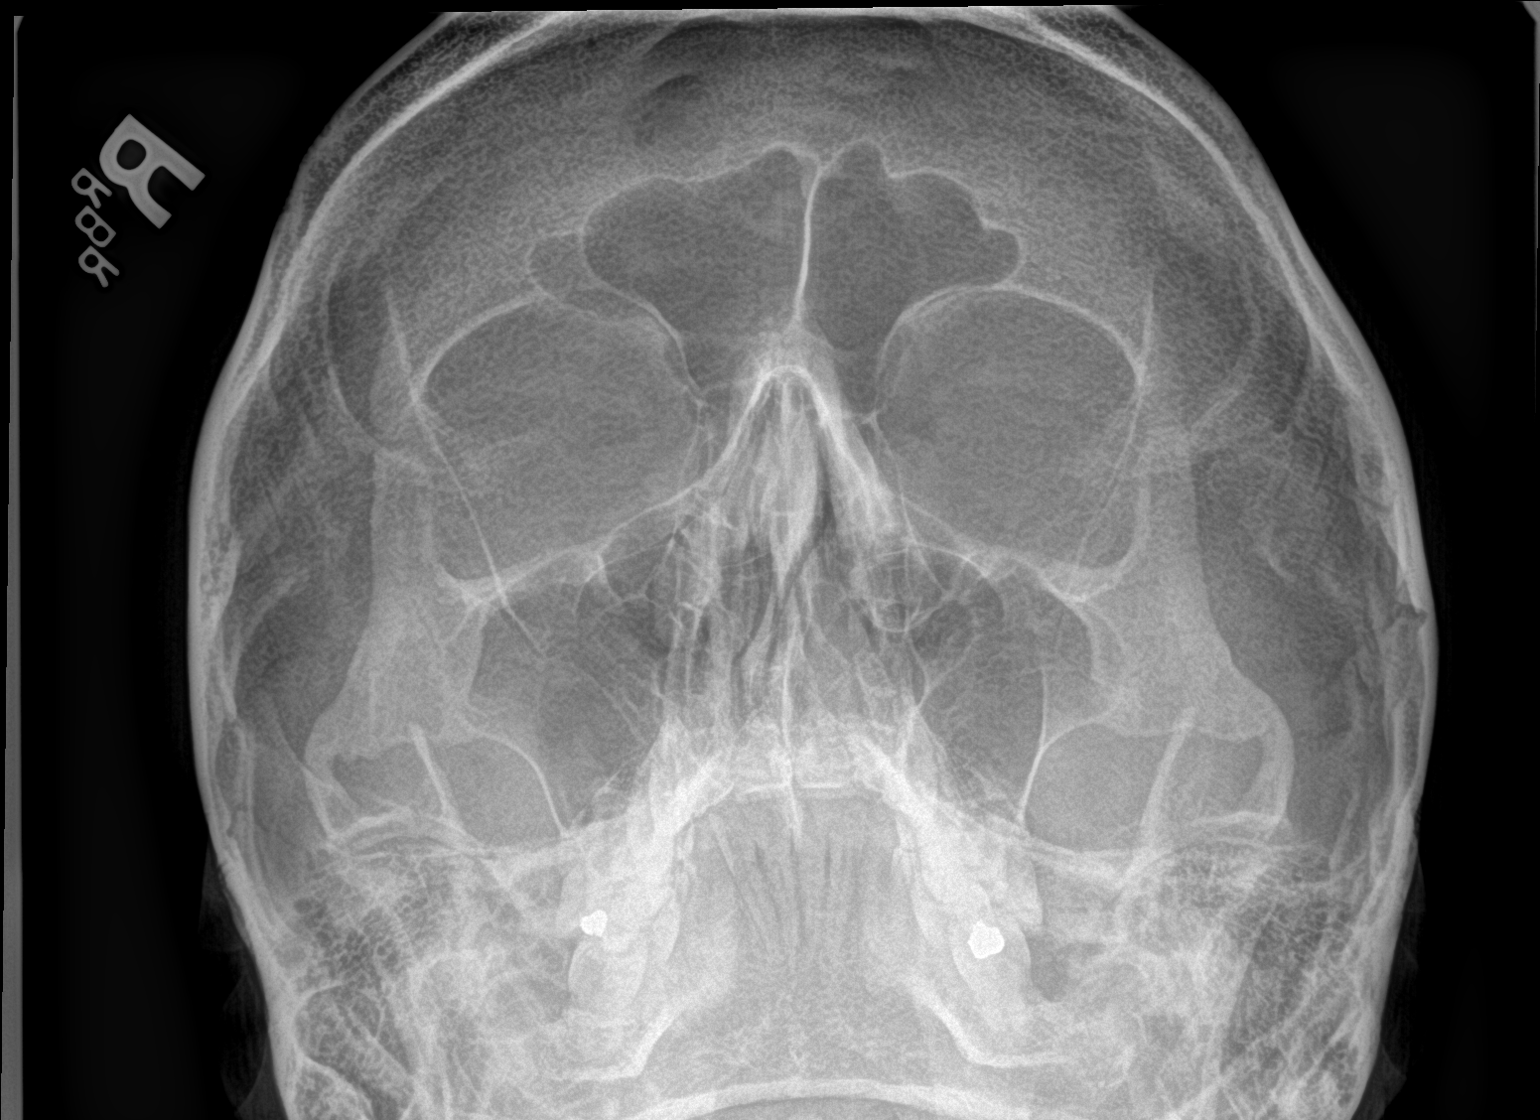

[orbits waters (2 of 2)]
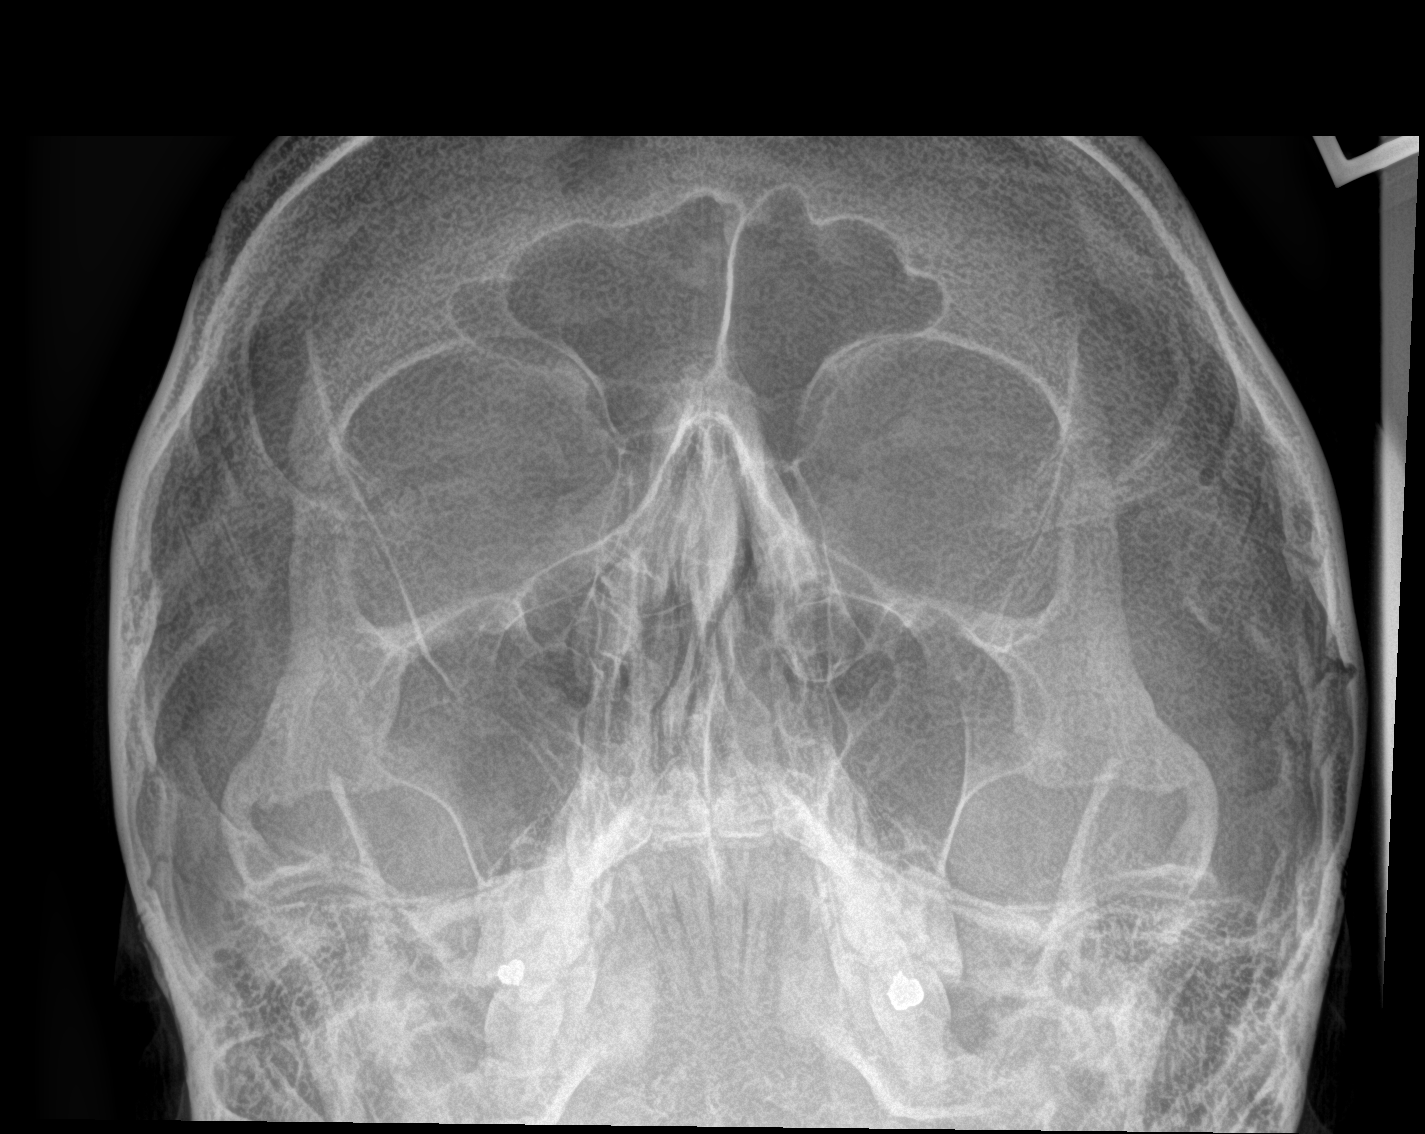

[2 of 2 positions shown; findings below may reference images not displayed]

FINDINGS: There is no evidence of metallic foreign body within the orbits. No
significant bone abnormality identified.
IMPRESSION: No evidence of metallic foreign body within the orbits.

## 2019-07-05 ENCOUNTER — Encounter: Payer: Self-pay | Admitting: Family Medicine

## 2019-07-08 NOTE — Telephone Encounter (Signed)
Think this should go to you 

## 2020-01-06 ENCOUNTER — Other Ambulatory Visit: Payer: Self-pay | Admitting: Family Medicine

## 2020-01-06 DIAGNOSIS — I10 Essential (primary) hypertension: Secondary | ICD-10-CM
